# Patient Record
Sex: Male | Born: 1995
Health system: Southern US, Community
[De-identification: ages and names within clinical notes are randomized; demographics above are authoritative.]

---
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
## 2018-01-23 DIAGNOSIS — R197 Diarrhea, unspecified: Secondary | ICD-10-CM | POA: Diagnosis not present

## 2018-01-23 DIAGNOSIS — F062 Psychotic disorder with delusions due to known physiological condition: Secondary | ICD-10-CM | POA: Diagnosis not present

## 2018-01-23 DIAGNOSIS — F22 Delusional disorders: Secondary | ICD-10-CM | POA: Diagnosis not present

## 2018-04-15 DIAGNOSIS — S6291XA Unspecified fracture of right wrist and hand, initial encounter for closed fracture: Secondary | ICD-10-CM | POA: Diagnosis not present

## 2018-04-15 DIAGNOSIS — S62356A Nondisplaced fracture of shaft of fifth metacarpal bone, right hand, initial encounter for closed fracture: Secondary | ICD-10-CM | POA: Diagnosis not present

## 2018-06-18 DIAGNOSIS — M79641 Pain in right hand: Secondary | ICD-10-CM | POA: Diagnosis not present

## 2019-02-19 ENCOUNTER — Ambulatory Visit (HOSPITAL_COMMUNITY)
Admission: EM | Admit: 2019-02-19 | Discharge: 2019-02-19 | Disposition: A | Discharge status: Home / Self Care | Payer: Self-pay | Attending: Urgent Care | Admitting: Urgent Care

## 2019-02-19 ENCOUNTER — Other Ambulatory Visit: Payer: Self-pay

## 2019-02-19 ENCOUNTER — Encounter (HOSPITAL_COMMUNITY): Payer: Self-pay

## 2019-02-19 DIAGNOSIS — W260XXA Contact with knife, initial encounter: Secondary | ICD-10-CM

## 2019-02-19 DIAGNOSIS — M79644 Pain in right finger(s): Secondary | ICD-10-CM

## 2019-02-19 DIAGNOSIS — S61216A Laceration without foreign body of right little finger without damage to nail, initial encounter: Secondary | ICD-10-CM

## 2019-02-19 DIAGNOSIS — Z23 Encounter for immunization: Secondary | ICD-10-CM

## 2019-02-19 DIAGNOSIS — S61237A Puncture wound without foreign body of left little finger without damage to nail, initial encounter: Secondary | ICD-10-CM

## 2019-02-19 DIAGNOSIS — S6992XA Unspecified injury of left wrist, hand and finger(s), initial encounter: Secondary | ICD-10-CM

## 2019-02-19 MED ORDER — TETANUS-DIPHTH-ACELL PERTUSSIS 5-2.5-18.5 LF-MCG/0.5 IM SUSP
INTRAMUSCULAR | Status: AC
  Filled 2019-02-19: qty 0.5

## 2019-02-19 MED ORDER — TETANUS-DIPHTH-ACELL PERTUSSIS 5-2.5-18.5 LF-MCG/0.5 IM SUSP
0.5000 mL | Freq: Once | INTRAMUSCULAR | Status: AC
  Administered 2019-02-19: 0.5 mL via INTRAMUSCULAR

## 2019-02-19 MED ORDER — LIDOCAINE HCL (PF) 2 % IJ SOLN
INTRAMUSCULAR | Status: AC
  Filled 2019-02-19: qty 2

## 2019-02-19 NOTE — ED Provider Notes (Addendum)
  MRN: [id number] DOB: 16-Dec-1995  Subjective:   Joshua Casey is a 23 y.o. male presenting for suffering acute laceration of his right pinky finger today.  Patient cut himself accidentally while using a knife washing dishes.  States that he is able to move his finger but the laceration is tender but not swollen and still has sensation of his finger.  He also states that 3 days ago he suffered a left pinky puncture wound.  Has been keeping both wounds covered.  Cannot recall his last Tdap. He is not currently taking any medications and has no known food or drug allergies.  Denies past medical and surgical history.   ROS  Objective:   Vitals: BP 115/68 (BP Location: Right Arm)   Pulse 66   Temp 98.4 F (36.9 C) (Oral)   Resp 18   Wt 168 lb (76.2 kg)   SpO2 100%   Physical Exam Constitutional:      Appearance: Normal appearance. He is well-developed and normal weight.  HENT:     Head: Normocephalic and atraumatic.     Right Ear: External ear normal.     Left Ear: External ear normal.     Nose: Nose normal.     Mouth/Throat:     Pharynx: Oropharynx is clear.  Eyes:     Extraocular Movements: Extraocular movements intact.     Pupils: Pupils are equal, round, and reactive to light.  Cardiovascular:     Rate and Rhythm: Normal rate.  Pulmonary:     Effort: Pulmonary effort is normal.  Musculoskeletal:       Arms:     Right hand: He exhibits tenderness and laceration. He exhibits normal range of motion, no bony tenderness, normal capillary refill, no deformity and no swelling. Normal sensation noted. Normal strength noted.  Neurological:     Mental Status: He is alert and oriented to person, place, and time.  Psychiatric:        Mood and Affect: Mood normal.        Behavior: Behavior normal.     PROCEDURE NOTE: laceration repair Verbal consent obtained from patient.  Local anesthesia with 1cc Lidocaine 2% without epinephrine.  Wound explored for tendon, ligament damage.  Wound scrubbed with soap and water and rinsed. Wound closed with #1 4-0 Prolene (horizontal mattress) sutures.  Wound cleansed and dressed.   Assessment and Plan :   1. Laceration of right little finger without foreign body without damage to nail, initial encounter   2. Finger injury, left, initial encounter   3. Finger pain, right     Laceration repaired successfully. Wound care reviewed for laceration and puncture wound.  Tdap updated today.  Use APAP and ibuprofen for pain relief.  Return-to-clinic precautions discussed, patient verbalized understanding. Otherwise, follow up in 7 to 10 days for suture removal.      Jaynee Eagles, PA-C 02/21/19 [place], [place] 02/21/19 1050

## 2019-02-19 NOTE — ED Triage Notes (Signed)
Pt states he has a laceration to his right hand the pinky finger. This happened today. Pt states he has a puncture wound on his left hand pinky finger.

## 2019-02-19 NOTE — Discharge Instructions (Signed)
You may take 500mg Tylenol with ibuprofen 600mg every 6 hours for pain and inflammation. ° °

## 2019-02-21 ENCOUNTER — Encounter (HOSPITAL_COMMUNITY): Payer: Self-pay | Admitting: Urgent Care

## 2020-05-06 ENCOUNTER — Other Ambulatory Visit (HOSPITAL_COMMUNITY): Payer: Self-pay

## 2020-06-01 MED FILL — AMOXICILLIN 500 MG CAPSULE: 500 | 5 days supply | Qty: 20 | Fill #0
